# Patient Record
Sex: Male | Born: 1960 | Race: White | Hispanic: No | Marital: Single | Smoking: Former smoker
Health system: Southern US, Community
[De-identification: ages and names within clinical notes are randomized; demographics above are authoritative.]

## PROBLEM LIST (undated history)

## (undated) DIAGNOSIS — K219 Gastro-esophageal reflux disease without esophagitis: Secondary | ICD-10-CM

## (undated) DIAGNOSIS — I10 Essential (primary) hypertension: Secondary | ICD-10-CM

## (undated) DIAGNOSIS — K760 Fatty (change of) liver, not elsewhere classified: Secondary | ICD-10-CM

## (undated) DIAGNOSIS — N200 Calculus of kidney: Secondary | ICD-10-CM

## (undated) DIAGNOSIS — E7800 Pure hypercholesterolemia, unspecified: Secondary | ICD-10-CM

## (undated) DIAGNOSIS — J45909 Unspecified asthma, uncomplicated: Secondary | ICD-10-CM

## (undated) DIAGNOSIS — N529 Male erectile dysfunction, unspecified: Secondary | ICD-10-CM

## (undated) DIAGNOSIS — K224 Dyskinesia of esophagus: Secondary | ICD-10-CM

## (undated) DIAGNOSIS — E785 Hyperlipidemia, unspecified: Secondary | ICD-10-CM

## (undated) HISTORY — DX: Dyskinesia of esophagus: K22.4

## (undated) HISTORY — PX: CERVICAL DISCECTOMY: SHX98

## (undated) HISTORY — DX: Gastro-esophageal reflux disease without esophagitis: K21.9

## (undated) HISTORY — DX: Unspecified asthma, uncomplicated: J45.909

## (undated) HISTORY — DX: Fatty (change of) liver, not elsewhere classified: K76.0

## (undated) HISTORY — DX: Hyperlipidemia, unspecified: E78.5

## (undated) HISTORY — DX: Male erectile dysfunction, unspecified: N52.9

## (undated) HISTORY — DX: Calculus of kidney: N20.0

## (undated) HISTORY — DX: Pure hypercholesterolemia, unspecified: E78.00

## (undated) HISTORY — DX: Essential (primary) hypertension: I10

---

## 2004-02-05 ENCOUNTER — Encounter: Admission: RE | Admit: 2004-02-05 | Discharge: 2004-02-05 | Payer: Self-pay | Admitting: Emergency Medicine

## 2005-05-29 ENCOUNTER — Ambulatory Visit: Payer: Self-pay | Admitting: Gastroenterology

## 2005-05-30 ENCOUNTER — Ambulatory Visit: Payer: Self-pay | Admitting: Gastroenterology

## 2005-05-31 ENCOUNTER — Ambulatory Visit: Payer: Self-pay | Admitting: Gastroenterology

## 2006-01-08 ENCOUNTER — Encounter: Admission: RE | Admit: 2006-01-08 | Discharge: 2006-01-08 | Payer: Self-pay | Admitting: Emergency Medicine

## 2015-09-30 ENCOUNTER — Other Ambulatory Visit: Payer: Self-pay | Admitting: Physician Assistant

## 2015-09-30 ENCOUNTER — Ambulatory Visit
Admission: RE | Admit: 2015-09-30 | Discharge: 2015-09-30 | Disposition: A | Discharge status: Home / Self Care | Payer: BLUE CROSS/BLUE SHIELD | Source: Ambulatory Visit | Attending: Physician Assistant | Admitting: Physician Assistant

## 2015-09-30 DIAGNOSIS — M25551 Pain in right hip: Secondary | ICD-10-CM | POA: Diagnosis not present

## 2015-09-30 DIAGNOSIS — K219 Gastro-esophageal reflux disease without esophagitis: Secondary | ICD-10-CM | POA: Diagnosis not present

## 2015-09-30 DIAGNOSIS — E785 Hyperlipidemia, unspecified: Secondary | ICD-10-CM | POA: Diagnosis not present

## 2015-09-30 DIAGNOSIS — M25552 Pain in left hip: Principal | ICD-10-CM

## 2015-09-30 DIAGNOSIS — I10 Essential (primary) hypertension: Secondary | ICD-10-CM | POA: Diagnosis not present

## 2015-09-30 DIAGNOSIS — M2550 Pain in unspecified joint: Secondary | ICD-10-CM | POA: Diagnosis not present

## 2016-10-15 DIAGNOSIS — L720 Epidermal cyst: Secondary | ICD-10-CM | POA: Diagnosis not present

## 2017-01-24 DIAGNOSIS — I10 Essential (primary) hypertension: Secondary | ICD-10-CM | POA: Diagnosis not present

## 2017-01-24 DIAGNOSIS — Z23 Encounter for immunization: Secondary | ICD-10-CM | POA: Diagnosis not present

## 2017-01-24 DIAGNOSIS — E7800 Pure hypercholesterolemia, unspecified: Secondary | ICD-10-CM | POA: Diagnosis not present

## 2017-08-06 ENCOUNTER — Other Ambulatory Visit: Payer: Self-pay | Admitting: Gastroenterology

## 2017-08-06 DIAGNOSIS — R109 Unspecified abdominal pain: Secondary | ICD-10-CM

## 2017-08-07 ENCOUNTER — Other Ambulatory Visit: Payer: Self-pay | Admitting: Gastroenterology

## 2017-08-07 DIAGNOSIS — R945 Abnormal results of liver function studies: Principal | ICD-10-CM

## 2017-08-07 DIAGNOSIS — R7989 Other specified abnormal findings of blood chemistry: Secondary | ICD-10-CM

## 2017-08-10 ENCOUNTER — Ambulatory Visit
Admission: RE | Admit: 2017-08-10 | Discharge: 2017-08-10 | Disposition: A | Discharge status: Home / Self Care | Payer: BLUE CROSS/BLUE SHIELD | Source: Ambulatory Visit | Attending: Gastroenterology | Admitting: Gastroenterology

## 2017-08-10 DIAGNOSIS — R945 Abnormal results of liver function studies: Principal | ICD-10-CM

## 2017-08-10 DIAGNOSIS — R7989 Other specified abnormal findings of blood chemistry: Secondary | ICD-10-CM | POA: Diagnosis not present

## 2017-08-14 ENCOUNTER — Other Ambulatory Visit: Payer: BLUE CROSS/BLUE SHIELD

## 2017-08-21 ENCOUNTER — Other Ambulatory Visit: Payer: Self-pay | Admitting: Gastroenterology

## 2017-08-21 DIAGNOSIS — K760 Fatty (change of) liver, not elsewhere classified: Secondary | ICD-10-CM

## 2017-08-21 DIAGNOSIS — R1012 Left upper quadrant pain: Secondary | ICD-10-CM

## 2017-08-22 ENCOUNTER — Ambulatory Visit
Admission: RE | Admit: 2017-08-22 | Discharge: 2017-08-22 | Disposition: A | Discharge status: Home / Self Care | Payer: BLUE CROSS/BLUE SHIELD | Source: Ambulatory Visit | Attending: Gastroenterology | Admitting: Gastroenterology

## 2017-08-22 DIAGNOSIS — K760 Fatty (change of) liver, not elsewhere classified: Secondary | ICD-10-CM

## 2017-08-22 DIAGNOSIS — R1012 Left upper quadrant pain: Secondary | ICD-10-CM

## 2017-08-22 DIAGNOSIS — R140 Abdominal distension (gaseous): Secondary | ICD-10-CM | POA: Diagnosis not present

## 2017-08-22 MED ORDER — IOPAMIDOL (ISOVUE-300) INJECTION 61%
100.0000 mL | Freq: Once | INTRAVENOUS | Status: AC | PRN
  Administered 2017-08-22: 100 mL via INTRAVENOUS

## 2017-10-22 DIAGNOSIS — N281 Cyst of kidney, acquired: Secondary | ICD-10-CM | POA: Diagnosis not present

## 2017-10-22 DIAGNOSIS — R109 Unspecified abdominal pain: Secondary | ICD-10-CM | POA: Diagnosis not present

## 2017-12-18 DIAGNOSIS — I10 Essential (primary) hypertension: Secondary | ICD-10-CM | POA: Diagnosis not present

## 2017-12-18 DIAGNOSIS — Z125 Encounter for screening for malignant neoplasm of prostate: Secondary | ICD-10-CM | POA: Diagnosis not present

## 2017-12-18 DIAGNOSIS — E7800 Pure hypercholesterolemia, unspecified: Secondary | ICD-10-CM | POA: Diagnosis not present

## 2017-12-18 DIAGNOSIS — J45909 Unspecified asthma, uncomplicated: Secondary | ICD-10-CM | POA: Diagnosis not present

## 2018-06-26 DIAGNOSIS — Z0000 Encounter for general adult medical examination without abnormal findings: Secondary | ICD-10-CM | POA: Diagnosis not present

## 2018-06-26 DIAGNOSIS — E7800 Pure hypercholesterolemia, unspecified: Secondary | ICD-10-CM | POA: Diagnosis not present

## 2018-12-02 DIAGNOSIS — J45909 Unspecified asthma, uncomplicated: Secondary | ICD-10-CM | POA: Diagnosis not present

## 2018-12-02 DIAGNOSIS — I10 Essential (primary) hypertension: Secondary | ICD-10-CM | POA: Diagnosis not present

## 2018-12-02 DIAGNOSIS — E7800 Pure hypercholesterolemia, unspecified: Secondary | ICD-10-CM | POA: Diagnosis not present

## 2018-12-02 DIAGNOSIS — N529 Male erectile dysfunction, unspecified: Secondary | ICD-10-CM | POA: Diagnosis not present

## 2019-03-26 DIAGNOSIS — E7800 Pure hypercholesterolemia, unspecified: Secondary | ICD-10-CM | POA: Diagnosis not present

## 2019-03-26 DIAGNOSIS — I10 Essential (primary) hypertension: Secondary | ICD-10-CM | POA: Diagnosis not present

## 2019-03-26 DIAGNOSIS — K219 Gastro-esophageal reflux disease without esophagitis: Secondary | ICD-10-CM | POA: Diagnosis not present

## 2019-07-04 IMAGING — US US ABDOMEN COMPLETE
2 series · 13 of 25 positions shown · non-contrast
Comparison: 01/08/2006 CT

CLINICAL DATA: Elevated LFT

EXAM:
ABDOMEN ULTRASOUND COMPLETE

[Series 1: us abdomen complete · 0.23mm/px · 11 of 84 slices shown (1 of 2)]
[im 1/84]
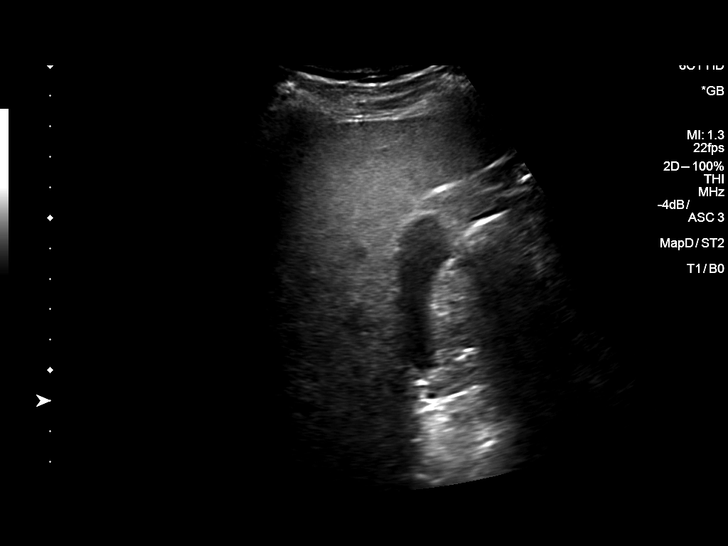
[im 8/84]
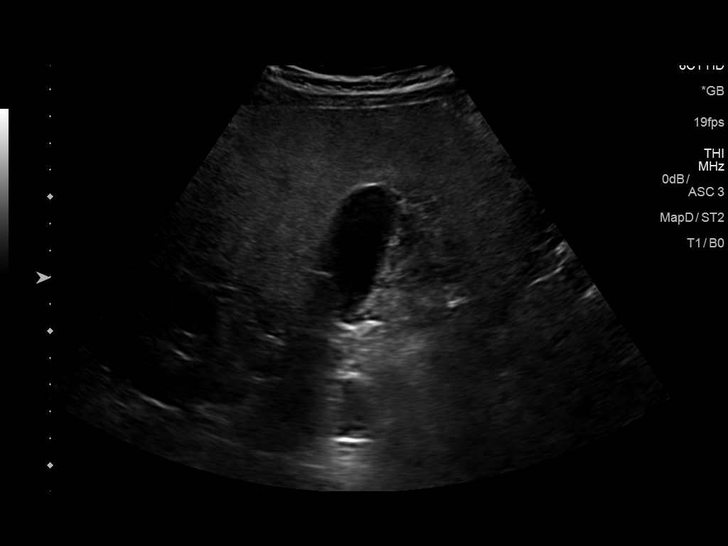
[im 16/84]
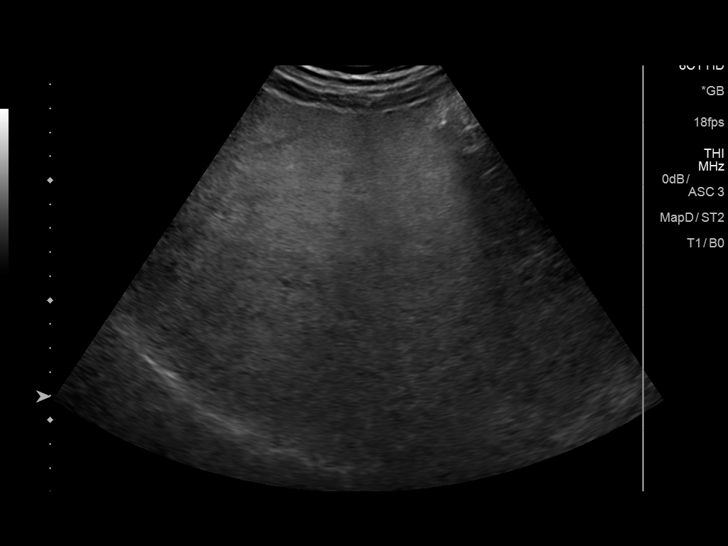
[im 24/84]
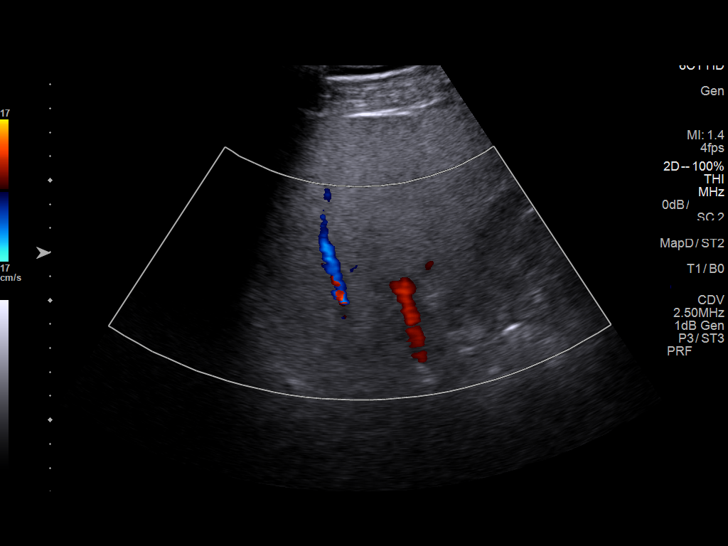
[im 32/84]
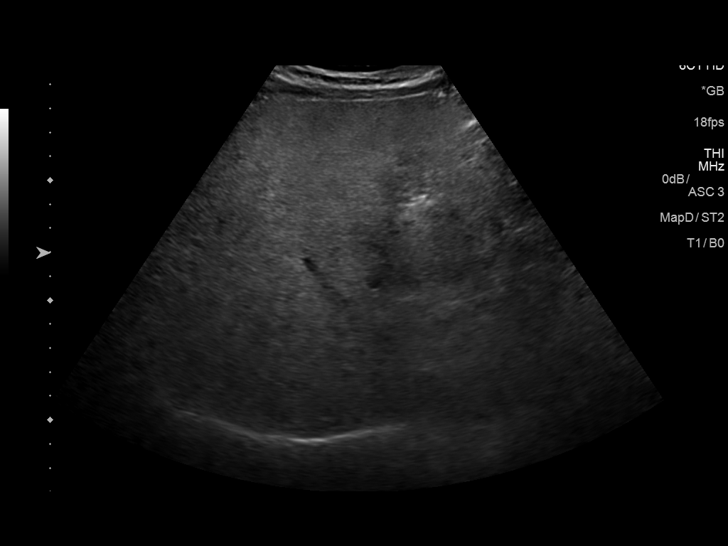
[im 40/84]
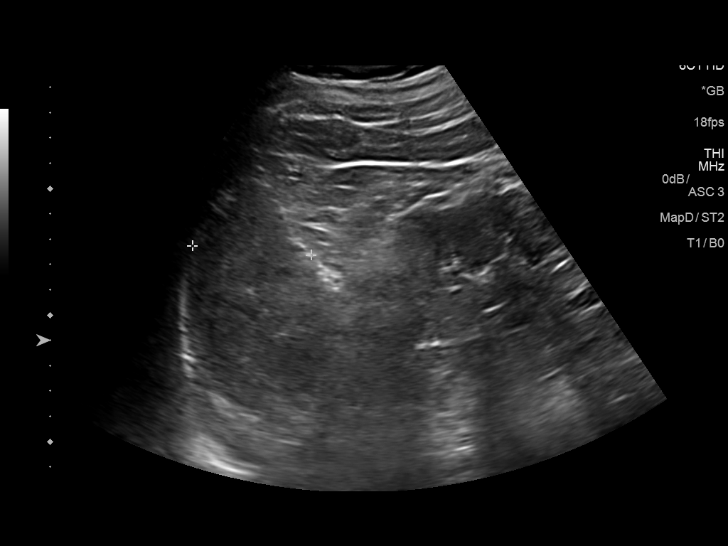
[im 48/84]
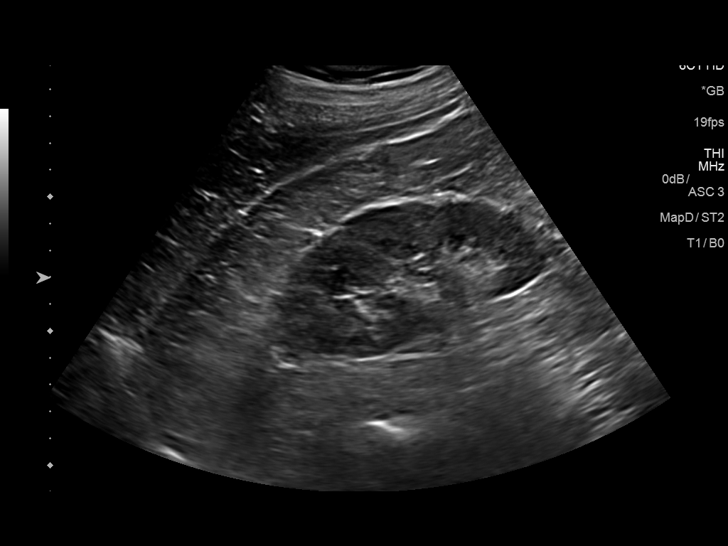
[im 56/84]
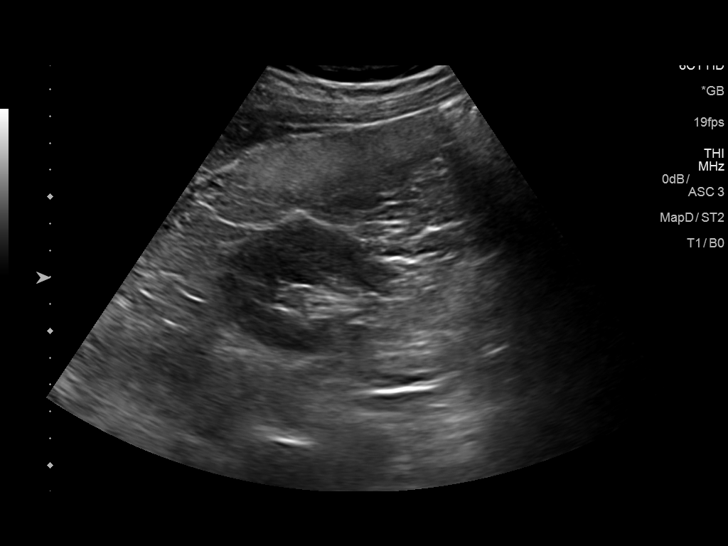
[im 64/84]
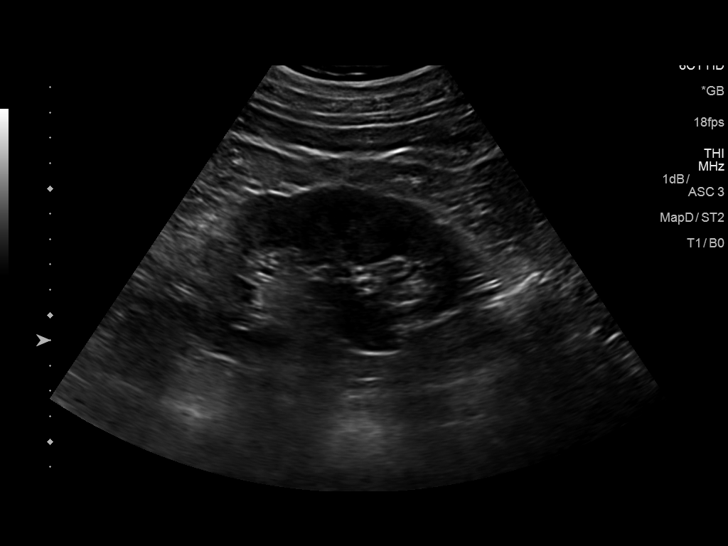
[im 72/84]
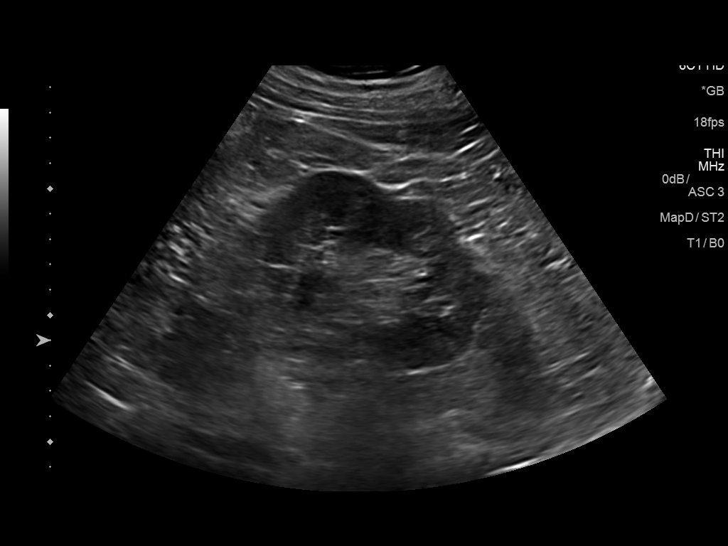
[im 80/84]
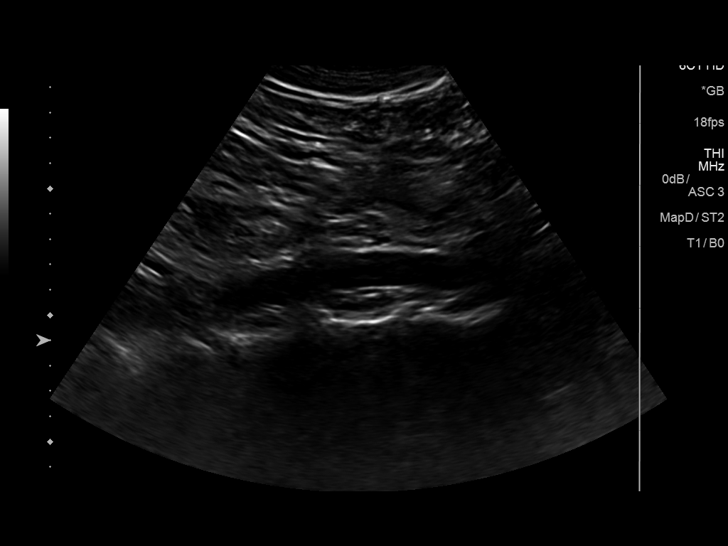

[Series 2001: us abdomen complete · 0.23mm/px · 2 of 11 slices shown (2 of 2)]
[im 1/11]
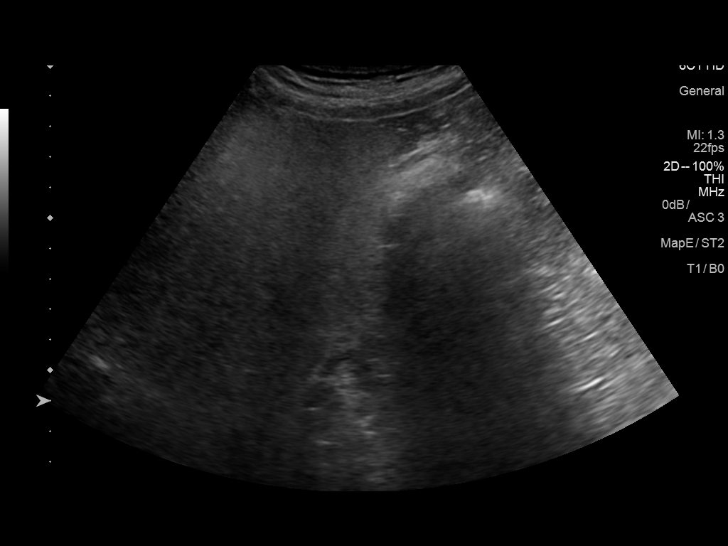
[im 11/11]
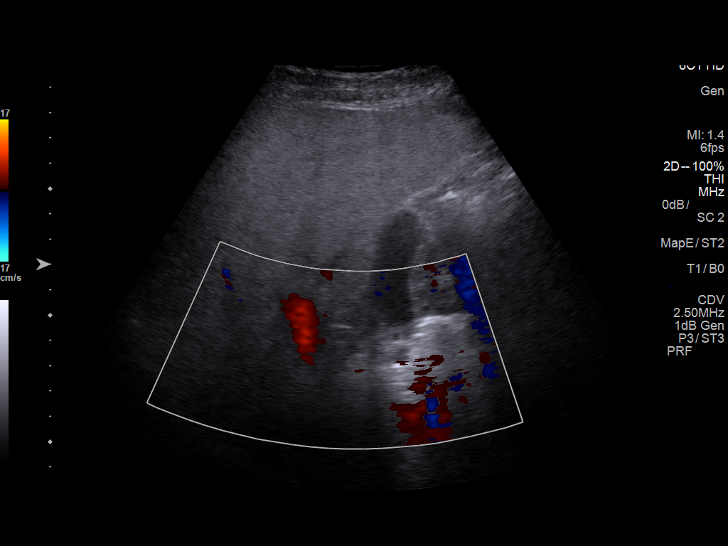

[13 of 25 positions shown; findings below may reference images not displayed]

FINDINGS: Gallbladder: No gallstones or wall thickening visualized. No
sonographic Murphy sign noted by sonographer.

Common bile duct: Diameter: 5.4 mm

Liver: Increased echogenicity without focal abnormality. Portal vein
is patent on color Doppler imaging with normal direction of blood
flow towards the liver.

IVC: No abnormality visualized.

Pancreas: Visualized portion unremarkable.

Spleen: Size and appearance within normal limits.

Right Kidney: Length: 11.8 cm. Echogenicity within normal limits. No
hydronephrosis.

Left Kidney: Length: 13.7 cm. Echogenicity within normal limits.
Dilated extrarenal pelvis.

Abdominal aorta: Aortic atherosclerosis. Maximum AP diameter of 3
cm.

Other findings: None.
IMPRESSION: 1. Coarse echogenic liver consistent with hepatic steatosis and or
hepatocellular disease.
2. Negative for gallstones or biliary dilatation
3. Aortic atherosclerosis with maximum AP diameter of the aorta up
to 3 cm. Recommend followup by ultrasound in 3 years. This
recommendation follows ACR consensus guidelines: White Paper of the
ACR Incidental Findings Committee II on Vascular Findings. [HOSPITAL] 2863; [DATE]

## 2019-07-21 DIAGNOSIS — Z0000 Encounter for general adult medical examination without abnormal findings: Secondary | ICD-10-CM | POA: Diagnosis not present

## 2019-07-21 DIAGNOSIS — Z125 Encounter for screening for malignant neoplasm of prostate: Secondary | ICD-10-CM | POA: Diagnosis not present

## 2019-07-21 DIAGNOSIS — E7800 Pure hypercholesterolemia, unspecified: Secondary | ICD-10-CM | POA: Diagnosis not present

## 2019-07-23 DIAGNOSIS — Z1159 Encounter for screening for other viral diseases: Secondary | ICD-10-CM | POA: Diagnosis not present

## 2019-07-28 DIAGNOSIS — D123 Benign neoplasm of transverse colon: Secondary | ICD-10-CM | POA: Diagnosis not present

## 2019-07-28 DIAGNOSIS — Z800 Family history of malignant neoplasm of digestive organs: Secondary | ICD-10-CM | POA: Diagnosis not present

## 2019-07-28 DIAGNOSIS — Z1211 Encounter for screening for malignant neoplasm of colon: Secondary | ICD-10-CM | POA: Diagnosis not present

## 2020-01-21 DIAGNOSIS — E7800 Pure hypercholesterolemia, unspecified: Secondary | ICD-10-CM | POA: Diagnosis not present

## 2020-01-21 DIAGNOSIS — K219 Gastro-esophageal reflux disease without esophagitis: Secondary | ICD-10-CM | POA: Diagnosis not present

## 2020-01-21 DIAGNOSIS — I10 Essential (primary) hypertension: Secondary | ICD-10-CM | POA: Diagnosis not present

## 2020-01-21 DIAGNOSIS — R002 Palpitations: Secondary | ICD-10-CM | POA: Diagnosis not present

## 2020-02-19 ENCOUNTER — Encounter: Payer: Self-pay | Admitting: Internal Medicine

## 2020-02-19 ENCOUNTER — Ambulatory Visit: Payer: BC Managed Care – PPO | Admitting: Internal Medicine

## 2020-02-19 ENCOUNTER — Encounter: Payer: Self-pay | Admitting: *Deleted

## 2020-02-19 ENCOUNTER — Other Ambulatory Visit: Payer: Self-pay

## 2020-02-19 VITALS — BP 122/82 | HR 91 | Ht 66.0 in | Wt 178.0 lb

## 2020-02-19 DIAGNOSIS — E785 Hyperlipidemia, unspecified: Secondary | ICD-10-CM

## 2020-02-19 DIAGNOSIS — I10 Essential (primary) hypertension: Secondary | ICD-10-CM | POA: Diagnosis not present

## 2020-02-19 DIAGNOSIS — R002 Palpitations: Secondary | ICD-10-CM | POA: Insufficient documentation

## 2020-02-19 NOTE — Patient Instructions (Addendum)
Medication Instructions:  Your physician recommends that you continue on your current medications as directed. Please refer to the Current Medication list given to you today.  *If you need a refill on your cardiac medications before your next appointment, please call your pharmacy*   Lab Work: none If you have labs (blood work) drawn today and your tests are completely normal, you will receive your results only by: Marland Kitchen MyChart Message (if you have MyChart) OR . A paper copy in the mail If you have any lab test that is abnormal or we need to change your treatment, we will call you to review the results.   Testing/Procedures: Your physician has recommended that you wear a holter monitor. Holter monitors are medical devices that record the heart's electrical activity. Doctors most often use these monitors to diagnose arrhythmias. Arrhythmias are problems with the speed or rhythm of the heartbeat. The monitor is a small, portable device. You can wear one while you do your normal daily activities. This is usually used to diagnose what is causing palpitations/syncope (passing out).--14 day zio     Follow-Up: At [facility], you and your health needs are our priority.  As part of our continuing mission to provide you with exceptional heart care, we have created designated Provider Care Teams.  These Care Teams include your primary Cardiologist (physician) and Advanced Practice Providers (APPs -  Physician Assistants and Nurse Practitioners) who all work together to provide you with the care you need, when you need it.  We recommend signing up for the patient portal called "MyChart".  Sign up information is provided on this After Visit Summary.  MyChart is used to connect with patients for Virtual Visits (Telemedicine).  Patients are able to view lab/test results, encounter notes, upcoming appointments, etc.  Non-urgent messages can be sent to your provider as well.   To learn more about what you  can do with MyChart, go to [email or web address].    Your next appointment:   one year(s)  The format for your next appointment:   In Person  Provider:   You may see Christell Constant, MD or one of the following Advanced Practice Providers on your designated Care Team:    Ronie Spies, PA-C  Jacolyn Reedy, PA-C    Other Instructions   Jerry James- Long Term Monitor Instructions   Your physician has requested you wear your ZIO patch monitor_14______days.   This is a single patch monitor.  [employer] supplies one patch monitor per enrollment.  Additional stickers are not available.   Please do not apply patch if you will be having a Nuclear Stress Test, Echocardiogram, Cardiac CT, MRI, or Chest Xray during the time frame you would be wearing the monitor. The patch cannot be worn during these tests.  You cannot remove and re-apply the ZIO XT patch monitor.   Your ZIO patch monitor will be sent USPS Priority mail from [facility] directly to your home address. The monitor may also be mailed to a PO BOX if home delivery is not available.   It may take 3-5 days to receive your monitor after you have been enrolled.   Once you have received you monitor, please review enclosed instructions.  Your monitor has already been registered assigning a specific monitor serial # to you.   Applying the monitor   Shave hair from upper left chest.   Hold abrader disc by orange tab.  Rub abrader in 40 strokes over left upper chest as indicated in  your monitor instructions.   Clean area with 4 enclosed alcohol pads .  Use all pads to assure are is cleaned thoroughly.  Let dry.   Apply patch as indicated in monitor instructions.  Patch will be place under collarbone on left side of chest with arrow pointing upward.   Rub patch adhesive wings for 2 minutes.Remove white label marked "1".  Remove white label marked "2".  Rub patch adhesive wings for 2 additional minutes.   While looking in a  mirror, press and release button in center of patch.  A small green light will flash 3-4 times .  This will be your only indicator the monitor has been turned on.     Do not shower for the first 24 hours.  You may shower after the first 24 hours.   Press button if you feel a symptom. You will hear a small click.  Record Date, Time and Symptom in the Patient Log Book.   When you are ready to remove patch, follow instructions on last 2 pages of Patient Log Book.  Stick patch monitor onto last page of Patient Log Book.   Place Patient Log Book in [place] box.  Use locking tab on box and tape box closed securely.  The [employer] and [employer] has [employer] on it.  Please place in mailbox as soon as possible.  Your physician should have your test results approximately 7 days after the monitor has been mailed back to [facility].   Call [facility] Customer Care at [phone] if you have questions regarding your ZIO XT patch monitor.  Call them immediately if you see an orange light blinking on your monitor.   If your monitor falls off in less than 4 days contact our Monitor department at [phone].  If your monitor becomes loose or falls off after 4 days call [employer] at [phone] for suggestions on securing your monitor.

## 2020-02-19 NOTE — Progress Notes (Signed)
Patient ID: Jerry James, male   DOB: 1961-03-02, 59 y.o.   MRN: [id number] Patient enrolled for [employer] to ship a 14 day ZIO XT long term holter monitor to his home.

## 2020-02-19 NOTE — Progress Notes (Signed)
Cardiology Office Note:    Date:  02/19/2020   ID:  Jerry James, DOB 1961-03-08, MRN [id number]  PCP:  Jerry Heron, MD   CC: Palpitations Consulted for the evaluation of palpitations at the behest of Jerry James, Jerry Dance, MD  History of Present Illness:    Jerry James is a 59 y.o. male with a hx of HTN, HLD, Erectily Dysfunction, and esophageal spasm who presents for palpitations.  Patient notes that he has had palpitations that have worsened over the years.  Heart would start racing with heart rate 140 bpm or above.  Would last for hours.  Symptomatic that would wake him from sleep. Would feel weak and faint.  Found that he had low blood pressure with recent stop on HCTZ.  And was diagnosed with acute prostatitis.  Complicated by hypotension.  Post the stopping of his blood pressure medication and with improvement in his hypotension; his palpitations have greatly improved.  During the time of his palpitations, he was changing a variety of blood pressure medications.   Ambulatory blood pressure- 120/80s only on losartan  Past Medical History:  Diagnosis Date  . Asthma   . ED (erectile dysfunction)   . Esophageal spasm   . Fatty liver   . GERD (gastroesophageal reflux disease)   . Hypercholesterolemia   . Hyperlipidemia   . Hypertension   . Kidney stones    Past Surgical History:  Procedure Laterality Date  . CERVICAL DISCECTOMY      Current Medications: Current Meds  Medication Sig  . albuterol (VENTOLIN HFA) 108 (90 Base) MCG/ACT inhaler Inhale into the lungs every 6 (six) hours as needed for wheezing or shortness of breath.  Marland Kitchen aspirin EC 81 MG tablet Take 81 mg by mouth daily. Swallow whole.  . esomeprazole (NEXIUM) 20 MG capsule Take 20 mg by mouth daily at 12 noon.  . fluticasone (FLOVENT HFA) 220 MCG/ACT inhaler Inhale into the lungs 2 (two) times daily.  Marland Kitchen losartan (COZAAR) 100 MG tablet Take 100 mg by mouth daily.  . Milk Thistle 1000 MG CAPS Take  by mouth.  . Nutritional Supplements (DHEA PO) Take 100 mg by mouth.  . Omega-3 Fatty Acids (FISH OIL) 1000 MG CAPS Take by mouth.  . rosuvastatin (CRESTOR) 10 MG tablet Take 10 mg by mouth daily.    Allergies:   Patient has no known allergies.   Social History   Socioeconomic History  . Marital status: Single    Spouse name: Not on file  . Number of children: Not on file  . Years of education: Not on file  . Highest education level: Not on file  Occupational History  . Not on file  Tobacco Use  . Smoking status: Former Smoker    Types: Cigarettes    Quit date: 2000    Years since quitting: 21.8  . Smokeless tobacco: Former Neurosurgeon    Types: Snuff    Quit date: 1980  Substance and Sexual Activity  . Alcohol use: Not on file  . Drug use: Not on file  . Sexual activity: Not on file  Other Topics Concern  . Not on file  Social History Narrative  . Not on file   Social Determinants of Health   Financial Resource Strain:   . Difficulty of Paying Living Expenses: Not on file  Food Insecurity:   . Worried About Programme researcher, broadcasting/film/video in the Last Year: Not on file  . Ran Out of Food in the  Last Year: Not on file  Transportation Needs:   . Lack of Transportation (Medical): Not on file  . Lack of Transportation (Non-Medical): Not on file  Physical Activity:   . Days of Exercise per Week: Not on file  . Minutes of Exercise per Session: Not on file  Stress:   . Feeling of Stress : Not on file  Social Connections:   . Frequency of Communication with Friends and Family: Not on file  . Frequency of Social Gatherings with Friends and Family: Not on file  . Attends Religious Services: Not on file  . Active Member of Clubs or Organizations: Not on file  . Attends Banker Meetings: Not on file  . Marital Status: Not on file    Family History: The patient's family history includes Benign prostatic hyperplasia in his brother; Cancer - Colon in his brother; Cancer - Lung in  his father; Diabetes in his paternal grandmother; Heart attack in his paternal grandfather; Hyperlipidemia in his brother; Hypertension in his brother and mother.  No family history of arrhythmias.  ROS:   Please see the history of present illness.    All other systems reviewed and are negative.  EKGs/Labs/Other Studies Reviewed:    The following studies were reviewed today:  EKG:  EKG is ordered today.  The ekg ordered today demonstrates sinus rhythm rate 91, no ST T changes.  01/21/2020 LDL 46 HDL 32 Triglycerides 106 Total 98  Creatinine 0.910 TSH 1.950  Physical Exam:    VS:  BP 122/82   Pulse 91   Ht 5\' 6"  (1.676 m)   Wt 178 lb (80.7 kg)   SpO2 95%   BMI 28.73 kg/m     Wt Readings from Last 3 Encounters:  02/19/20 178 lb (80.7 kg)     GEN: Well nourished, well developed in no acute distress HEENT: Normal NECK: No JVD; No carotid bruits LYMPHATICS: No lymphadenopathy CARDIAC: RRR, no murmurs, rubs, gallops RESPIRATORY:  Clear to auscultation without rales, wheezing or rhonchi  ABDOMEN: Soft, non-tender, non-distended MUSCULOSKELETAL:  No edema; No deformity  SKIN: Warm and dry NEUROLOGIC:  Alert and oriented x 3 PSYCHIATRIC:  Normal affect   ASSESSMENT:    1. Palpitations   2. Essential hypertension   3. Hyperlipidemia, unspecified hyperlipidemia type    PLAN:    In order of problems listed above:  Palpitations, HTN, and HLD - BP well controlled on current medications - HLD controlled well on current medications - palpitations may have been sinus tachycardia  - will check Ziopatch 14 day non live to rule out arrhythmia  1 year follow up unless new symptoms or abnormal test results warranting change in plan  Would be reasonable for Virtual Follow up Would be reasonable for APP Follow up   Medication Adjustments/Labs and Tests Ordered: Current medicines are reviewed at length with the patient today.  Concerns regarding medicines are outlined  above.  No orders of the defined types were placed in this encounter.  No orders of the defined types were placed in this encounter.   There are no Patient Instructions on file for this visit.   Signed, 02/21/20, MD  02/19/2020 1:57 PM    [facility]

## 2020-02-23 ENCOUNTER — Ambulatory Visit (INDEPENDENT_AMBULATORY_CARE_PROVIDER_SITE_OTHER): Payer: BC Managed Care – PPO

## 2020-02-23 DIAGNOSIS — R002 Palpitations: Secondary | ICD-10-CM | POA: Diagnosis not present

## 2020-02-24 DIAGNOSIS — R3915 Urgency of urination: Secondary | ICD-10-CM | POA: Diagnosis not present

## 2020-02-24 DIAGNOSIS — N410 Acute prostatitis: Secondary | ICD-10-CM | POA: Diagnosis not present

## 2020-02-24 DIAGNOSIS — N401 Enlarged prostate with lower urinary tract symptoms: Secondary | ICD-10-CM | POA: Diagnosis not present

## 2020-03-08 DIAGNOSIS — R609 Edema, unspecified: Secondary | ICD-10-CM | POA: Diagnosis not present

## 2020-03-12 DIAGNOSIS — R609 Edema, unspecified: Secondary | ICD-10-CM | POA: Diagnosis not present

## 2020-03-22 ENCOUNTER — Telehealth: Payer: Self-pay

## 2020-03-22 MED ORDER — METOPROLOL TARTRATE 25 MG PO TABS: 12.5000 mg | ORAL_TABLET | Freq: Two times a day (BID) | ORAL | 3 refills | Status: DC

## 2020-03-22 NOTE — Telephone Encounter (Signed)
The patient has been notified of the result and verbalized understanding.  All questions (if any) were answered. Leanord Hawking, RN 03/22/2020 9:14 AM

## 2020-03-22 NOTE — Telephone Encounter (Signed)
-----   Message from Christell Constant, MD sent at 03/21/2020  5:10 PM EST ----- Results: Rare, symptomatic SVT Plan Metoprolol 12.5 MG PO BID, have patient check blood pressure on this medication  Christell Constant, MD

## 2021-03-20 DIAGNOSIS — I471 Supraventricular tachycardia: Secondary | ICD-10-CM | POA: Insufficient documentation

## 2021-03-20 NOTE — Progress Notes (Signed)
Cardiology Office Note:    Date:  03/21/2021   ID:  Jerry James, DOB 04-Jul-1960, MRN [id number]  PCP:  Clayborn Heron, MD   [facility] Providers Cardiologist:  Christell Constant, MD     Referring MD: Clayborn Heron, MD   Chief Complaint:  f/u for SVT    Patient Profile:   Jerry James is a 60 y.o. male with:  Hypertension  Hyperlipidemia  Esophageal spasm Palpitations Monitor 11/21: Rare symptomatic PACs, brief Supraventricular Tachycardia Hepatic steatosis   History of Present Illness: Jerry James was evaluated in 10/21 by Dr. Izora Ribas for palpitations.  An event monitor demonstrated rare symptomatic PACs and brief (11 beats) Supraventricular Tachycardia.  He was started on metoprolol tartrate.  He returns for f/u.  He is here alone.  His palpitations are much improved on beta-blocker Rx.  He occasionally has skipped beats.  This is worse with caffeine or stress.  He has not had chest pain, shortness of breath, syncope, leg edema.         ASSESSMENT & PLAN:   SVT (supraventricular tachycardia) (HCC) Brief episodes noted on monitor last year.  This is much improved on beta-blocker Rx.  He is taking metoprolol tartrate 25 mg once daily daily instead of 12.5 mg twice daily.  We discussed changing to metoprolol succinate 50 mg once daily.  He would like to take the long acting form but only wants to take 25 mg once daily.    -DC metoprolol tartrate 25 mg  -Start Metoprolol succinate 25 mg once daily  -Continue to watch caffeine  -F/u 1 year.   Hyperlipidemia Managed by primary care.  Per [facility], LDL on 02/28/21 was 49.  He remains on Rosuvastatin.  Essential hypertension BP is well controlled on a combination Losartan, Metoprolol, HCTZ.            Dispo:  Return in about 1 year (around 03/21/2022) for Routine Follow Up w/ Dr. Izora Ribas.    Prior CV studies: LONG TERM MONITOR (8-14 DAYS) INTERPRETATION 03/17/2020 Narrative  Patient had a  min HR of 43 bpm, max HR of 172 bpm, and avg HR of 77 bpm.  Predominant underlying rhythm was sinus rhythm.  One run of tachycardia occurred lasting 11 beats with a max rate of 162 bpm.  This was triggered as symptomatic.  Isolated PACs were rare (<1.0%), with rare couplets or triplets present.  Isolated PVCs were rare (<1.0%), with rare couplets.  No evidence of complete heart block.  Triggered and diary events associated with sinus rhythm, SVT, and PACs. Rare, symptomatic, SVT and PACs.     Past Medical History:  Diagnosis Date   Asthma    ED (erectile dysfunction)    Esophageal spasm    Fatty liver    GERD (gastroesophageal reflux disease)    Hypercholesterolemia    Hyperlipidemia    Hypertension    Kidney stones    Current Medications: Current Meds  Medication Sig   albuterol (VENTOLIN HFA) 108 (90 Base) MCG/ACT inhaler Inhale into the lungs every 6 (six) hours as needed for wheezing or shortness of breath.   aspirin EC 81 MG tablet Take 81 mg by mouth daily. Swallow whole.   esomeprazole (NEXIUM) 20 MG capsule Take 20 mg by mouth daily at 12 noon.   fluticasone (FLOVENT HFA) 220 MCG/ACT inhaler Inhale into the lungs 2 (two) times daily.   hydrochlorothiazide (HYDRODIURIL) 25 MG tablet TAKE 1 TABLET BY MOUTH EVERY DAY IN THE MORNING  losartan (COZAAR) 100 MG tablet Take 100 mg by mouth daily.   metoprolol succinate (TOPROL XL) 25 MG 24 hr tablet Take 1 tablet (25 mg total) by mouth daily.   Milk Thistle 1000 MG CAPS Take 1,000 mg by mouth daily.   Nutritional Supplements (DHEA PO) Take 100 mg by mouth daily.   Omega-3 Fatty Acids (FISH OIL) 1000 MG CAPS Take 1,000 mg by mouth daily.   rosuvastatin (CRESTOR) 10 MG tablet Take 10 mg by mouth daily.   sildenafil (REVATIO) 20 MG tablet Take 20 mg by mouth as needed (dysfunction).   tamsulosin (FLOMAX) 0.4 MG CAPS capsule 0.4 mg daily.   [DISCONTINUED] metoprolol succinate (TOPROL-XL) 50 MG 24 hr tablet Take 1 tablet (50  mg total) by mouth daily. Take with or immediately following a meal.   [DISCONTINUED] metoprolol tartrate (LOPRESSOR) 25 MG tablet Take 0.5 tablets (12.5 mg total) by mouth 2 (two) times daily.    Allergies:   Patient has no known allergies.   Social History   Tobacco Use   Smoking status: Former    Types: Cigarettes    Quit date: 2000    Years since quitting: 22.9   Smokeless tobacco: Former    Types: Snuff    Quit date: 71    Family Hx: The patient's family history includes Benign prostatic hyperplasia in his brother; Cancer - Colon in his brother; Cancer - Lung in his father; Diabetes in his paternal grandmother; Heart attack in his paternal grandfather; Hyperlipidemia in his brother; Hypertension in his brother and mother.  ROS   EKGs/Labs/Other Test Reviewed:    EKG:  EKG is   ordered today.  The ekg ordered today demonstrates normal sinus rhythm, HR 65, normal axis, no ST-TW changes, QTc 399 ms  Recent Labs: No results found for requested labs within last 8760 hours.   Recent Lipid Panel No results found for: CHOL, TRIG, HDL, LDLCALC, LDLDIRECT   Risk Assessment/Calculations:          Physical Exam:    VS:  BP 120/78   Pulse 65   Ht 5\' 6"  (1.676 m)   Wt 171 lb 3.2 oz (77.7 kg)   SpO2 98%   BMI 27.63 kg/m     Wt Readings from Last 3 Encounters:  03/21/21 171 lb 3.2 oz (77.7 kg)  02/19/20 178 lb (80.7 kg)    Constitutional:      Appearance: Healthy appearance. Not in distress.  Neck:     Vascular: No carotid bruit. JVD normal.  Pulmonary:     Effort: Pulmonary effort is normal.     Breath sounds: No wheezing. No rales.  Cardiovascular:     Normal rate. Regular rhythm. Normal S1. Normal S2.      Murmurs: There is no murmur.  Pulses:    Intact distal pulses.  Edema:    Peripheral edema absent.  Abdominal:     Palpations: Abdomen is soft. There is no hepatomegaly.  Skin:    General: Skin is warm and dry.  Neurological:     General: No focal  deficit present.     Mental Status: Alert and oriented to person, place and time.     Cranial Nerves: Cranial nerves are intact.        Medication Adjustments/Labs and Tests Ordered: Current medicines are reviewed at length with the patient today.  Concerns regarding medicines are outlined above.  Tests Ordered: Orders Placed This Encounter  Procedures   EKG 12-Lead   Medication  Changes: Meds ordered this encounter  Medications   DISCONTD: metoprolol succinate (TOPROL-XL) 50 MG 24 hr tablet    Sig: Take 1 tablet (50 mg total) by mouth daily. Take with or immediately following a meal.    Dispense:  90 tablet    Refill:  3   metoprolol succinate (TOPROL XL) 25 MG 24 hr tablet    Sig: Take 1 tablet (25 mg total) by mouth daily.    Dispense:  90 tablet    Refill:  3   Signed, Tereso Newcomer, PA-C  03/21/2021 11:20 AM    [facility] [address] [place], [place], [place]  [address] Phone: [phone]; Fax: [phone]

## 2021-03-21 ENCOUNTER — Encounter: Payer: Self-pay | Admitting: Physician Assistant

## 2021-03-21 ENCOUNTER — Ambulatory Visit (INDEPENDENT_AMBULATORY_CARE_PROVIDER_SITE_OTHER): Payer: 59 | Admitting: Physician Assistant

## 2021-03-21 ENCOUNTER — Other Ambulatory Visit: Payer: Self-pay

## 2021-03-21 VITALS — BP 120/78 | HR 65 | Ht 66.0 in | Wt 171.2 lb

## 2021-03-21 DIAGNOSIS — I10 Essential (primary) hypertension: Secondary | ICD-10-CM | POA: Diagnosis not present

## 2021-03-21 DIAGNOSIS — E782 Mixed hyperlipidemia: Secondary | ICD-10-CM | POA: Diagnosis not present

## 2021-03-21 DIAGNOSIS — I471 Supraventricular tachycardia: Secondary | ICD-10-CM | POA: Diagnosis not present

## 2021-03-21 MED ORDER — METOPROLOL SUCCINATE ER 25 MG PO TB24: 25.0000 mg | ORAL_TABLET | Freq: Every day | ORAL | 3 refills | Status: DC

## 2021-03-21 MED ORDER — METOPROLOL SUCCINATE ER 50 MG PO TB24: 50.0000 mg | ORAL_TABLET | Freq: Every day | ORAL | 3 refills | Status: DC

## 2021-03-21 NOTE — Assessment & Plan Note (Signed)
Brief episodes noted on monitor last year.  This is much improved on beta-blocker Rx.  He is taking metoprolol tartrate 25 mg once daily daily instead of 12.5 mg twice daily.  We discussed changing to metoprolol succinate 50 mg once daily.  He would like to take the long acting form but only wants to take 25 mg once daily.    -DC metoprolol tartrate 25 mg  -Start Metoprolol succinate 25 mg once daily  -Continue to watch caffeine  -F/u 1 year.

## 2021-03-21 NOTE — Patient Instructions (Addendum)
Medication Instructions:    FINISH Metoprolol tartrate than stop  START Toprol XL one (1) tablet by mouth ( 25 mg) daily  *If you need a refill on your cardiac medications before your next appointment, please call your pharmacy*   Lab Work:  -NONE  If you have labs (blood work) drawn today and your tests are completely normal, you will receive your results only by: MyChart Message (if you have MyChart) OR A paper copy in the mail If you have any lab test that is abnormal or we need to change your treatment, we will call you to review the results.   Testing/Procedures:  -NONE   Follow-Up: At [facility], you and your health needs are our priority.  As part of our continuing mission to provide you with exceptional heart care, we have created designated Provider Care Teams.  These Care Teams include your primary Cardiologist (physician) and Advanced Practice Providers (APPs -  Physician Assistants and Nurse Practitioners) who all work together to provide you with the care you need, when you need it.  We recommend signing up for the patient portal called "MyChart".  Sign up information is provided on this After Visit Summary.  MyChart is used to connect with patients for Virtual Visits (Telemedicine).  Patients are able to view lab/test results, encounter notes, upcoming appointments, etc.  Non-urgent messages can be sent to your provider as well.   To learn more about what you can do with MyChart, go to [email or web address].    Your next appointment:   1 year(s)  The format for your next appointment:   In Person  Provider:   Christell Constant, MD     Other Instructions  Your physician wants you to follow-up in: 1 year with Dr. Izora Ribas.  You will receive a reminder letter in the mail two months in advance. If you don't receive a letter, please call our office to schedule the follow-up appointment.

## 2021-03-21 NOTE — Assessment & Plan Note (Signed)
Managed by primary care.  Per [facility], LDL on 02/28/21 was 49.  He remains on Rosuvastatin.

## 2021-03-21 NOTE — Assessment & Plan Note (Signed)
BP is well controlled on a combination Losartan, Metoprolol, HCTZ.

## 2021-09-21 ENCOUNTER — Other Ambulatory Visit: Payer: Self-pay | Admitting: Physician Assistant

## 2022-03-22 ENCOUNTER — Other Ambulatory Visit: Payer: Self-pay | Admitting: Internal Medicine

## 2022-03-29 ENCOUNTER — Ambulatory Visit: Payer: Commercial Managed Care - HMO | Attending: Internal Medicine | Admitting: Internal Medicine

## 2022-03-29 ENCOUNTER — Encounter: Payer: Self-pay | Admitting: Internal Medicine

## 2022-03-29 VITALS — BP 118/80 | HR 68 | Ht 66.0 in | Wt 178.0 lb

## 2022-03-29 DIAGNOSIS — I517 Cardiomegaly: Secondary | ICD-10-CM | POA: Diagnosis not present

## 2022-03-29 DIAGNOSIS — E782 Mixed hyperlipidemia: Secondary | ICD-10-CM

## 2022-03-29 DIAGNOSIS — I471 Supraventricular tachycardia, unspecified: Secondary | ICD-10-CM

## 2022-03-29 DIAGNOSIS — I10 Essential (primary) hypertension: Secondary | ICD-10-CM | POA: Diagnosis not present

## 2022-03-29 NOTE — Progress Notes (Signed)
Cardiology Office Note:    Date:  03/29/2022   ID:  Jerry James, DOB [birth year], MRN [id number]  PCP:  Jerry Heron, MD   CC: SVT f/u  History of Present Illness:    Jerry James is a 61 y.o. male with a hx of HTN, HLD, Erectile Dysfunction, and esophageal spasm. 2021: SVT 2022: transitioned to succinate with Tereso James  Patient notes that he is doing well.   Going to [place] (lives there 1/2 of the time). There are no interval hospital/ED visit.    No chest pain or pressure .  No SOB/DOE and no PND/Orthopnea.  No weight gain or leg swelling.  Rare palpitations that have improved.  Shorter duration.   He has lost weight and gained.    Past Medical History:  Diagnosis Date   Asthma    ED (erectile dysfunction)    Esophageal spasm    Fatty liver    GERD (gastroesophageal reflux disease)    Hypercholesterolemia    Hyperlipidemia    Hypertension    Kidney stones    Past Surgical History:  Procedure Laterality Date   CERVICAL DISCECTOMY      Current Medications: Current Meds  Medication Sig   albuterol (VENTOLIN HFA) 108 (90 Base) MCG/ACT inhaler Inhale into the lungs every 6 (six) hours as needed for wheezing or shortness of breath.   aspirin EC 81 MG tablet Take 81 mg by mouth daily. Swallow whole.   esomeprazole (NEXIUM) 20 MG capsule Take 20 mg by mouth daily at 12 noon.   hydrochlorothiazide (HYDRODIURIL) 25 MG tablet TAKE 1 TABLET BY MOUTH EVERY DAY IN THE MORNING   losartan (COZAAR) 100 MG tablet Take 100 mg by mouth daily.   metoprolol succinate (TOPROL-XL) 25 MG 24 hr tablet Take 1 tablet (25 mg total) by mouth daily. (NEED MD APPOINTMENT, SCHEDULE AN APPOINTMENT FOR FUTURE REFILLS) 1st Attempt   Milk Thistle 1000 MG CAPS Take 1,000 mg by mouth daily.   Nutritional Supplements (DHEA PO) Take 100 mg by mouth daily.   Omega-3 Fatty Acids (FISH OIL) 1000 MG CAPS Take 1,000 mg by mouth daily.   rosuvastatin (CRESTOR) 10 MG tablet Take 10 mg  by mouth daily.   sildenafil (REVATIO) 20 MG tablet Take 20 mg by mouth as needed (dysfunction).   tamsulosin (FLOMAX) 0.4 MG CAPS capsule 0.4 mg daily.   WIXELA INHUB 250-50 MCG/ACT AEPB Inhale 1 puff into the lungs 2 (two) times daily.   [DISCONTINUED] fluticasone (FLOVENT HFA) 220 MCG/ACT inhaler Inhale into the lungs 2 (two) times daily.    Allergies:   Patient has no known allergies.   Social History   Socioeconomic History   Marital status: Single    Spouse name: Not on file   Number of children: Not on file   Years of education: Not on file   Highest education level: Not on file  Occupational History   Not on file  Tobacco Use   Smoking status: Former    Types: Cigarettes    Quit date: 2000    Years since quitting: 23.9   Smokeless tobacco: Former    Types: Snuff    Quit date: 1980  Substance and Sexual Activity   Alcohol use: Not on file   Drug use: Not on file   Sexual activity: Not on file  Other Topics Concern   Not on file  Social History Narrative   Not on file   Social Determinants of Health  Financial Resource Strain: Not on file  Food Insecurity: Not on file  Transportation Needs: Not on file  Physical Activity: Not on file  Stress: Not on file  Social Connections: Not on file    Family History: The patient's family history includes Benign prostatic hyperplasia in his brother; Cancer - Colon in his brother; Cancer - Lung in his father; Diabetes in his paternal grandmother; Heart attack in his paternal grandfather; Hyperlipidemia in his brother; Hypertension in his brother and mother.  No family history of arrhythmias.  ROS:   Please see the history of present illness.    All other systems reviewed and are negative.  EKGs/Labs/Other Studies Reviewed:    The following studies were reviewed today:  EKG:   03/29/22: SR LVH 02/19/2020: sinus rhythm rate 91, no ST T changes.  Cardiac Studies & Procedures         MONITORS  LONG TERM MONITOR  (3-14 DAYS) 03/21/2020  Narrative  Patient had a min HR of 43 bpm, max HR of 172 bpm, and avg HR of 77 bpm.  Predominant underlying rhythm was sinus rhythm.  One run of tachycardia occurred lasting 11 beats with a max rate of 162 bpm.  This was triggered as symptomatic.  Isolated PACs were rare (<1.0%), with rare couplets or triplets present.  Isolated PVCs were rare (<1.0%), with rare couplets.  No evidence of complete heart block.  Triggered and diary events associated with sinus rhythm, SVT, and PACs.  Rare, symptomatic, SVT and PACs.           Physical Exam:    VS:  BP 118/80   Pulse 68   Ht 5\' 6"  (1.676 m)   Wt 178 lb (80.7 kg)   SpO2 96%   BMI 28.73 kg/m     Wt Readings from Last 3 Encounters:  03/29/22 178 lb (80.7 kg)  03/21/21 171 lb 3.2 oz (77.7 kg)  02/19/20 178 lb (80.7 kg)    GEN: Well nourished, well developed in no acute distress HEENT: Normal NECK: No JVD CARDIAC: RRR, no murmurs, rubs, gallops RESPIRATORY:  Occasional wheezes cleared after first inspiration no tachypnea  ABDOMEN: Soft, non-tender, non-distended MUSCULOSKELETAL:  No edema; No deformity  SKIN: Warm and dry NEUROLOGIC:  Alert and oriented x 3 PSYCHIATRIC:  Normal affect   ASSESSMENT:    1. SVT (supraventricular tachycardia)   2. LVH (left ventricular hypertrophy)   3. Essential hypertension   4. Mixed hyperlipidemia     PLAN:    SVT LVH - continue BB; we have discussed echo and stress needs before starting 1c as we would need to exclude severe LVH or ischemic disease  HTN - controlled on HCTZ, losartan   HLD Aortic atherosclerosis - reviewed his 2019 CT with him - LDL < 70 on current therapy; continue  Mild intermittent asthma - no change in therapy; sees PCP  One year me or APP   Medication Adjustments/Labs and Tests Ordered: Current medicines are reviewed at length with the patient today.  Concerns regarding medicines are outlined above.  Orders Placed  This Encounter  Procedures   EKG 12-Lead   No orders of the defined types were placed in this encounter.   Patient Instructions  Medication Instructions:  Your physician recommends that you continue on your current medications as directed. Please refer to the Current Medication list given to you today.  *If you need a refill on your cardiac medications before your next appointment, please call your pharmacy*  Lab  Work: If you have labs (blood work) drawn today and your tests are completely normal, you will receive your results only by: MyChart Message (if you have MyChart) OR A paper copy in the mail If you have any lab test that is abnormal or we need to change your treatment, we will call you to review the results.  Testing/Procedures: None ordered today.  Follow-Up: At [facility], you and your health needs are our priority.  As part of our continuing mission to provide you with exceptional heart care, we have created designated Provider Care Teams.  These Care Teams include your primary Cardiologist (physician) and Advanced Practice Providers (APPs -  Physician Assistants and Nurse Practitioners) who all work together to provide you with the care you need, when you need it.  We recommend signing up for the patient portal called "MyChart".  Sign up information is provided on this After Visit Summary.  MyChart is used to connect with patients for Virtual Visits (Telemedicine).  Patients are able to view lab/test results, encounter notes, upcoming appointments, etc.  Non-urgent messages can be sent to your provider as well.   To learn more about what you can do with MyChart, go to [email or web address].    Your next appointment:   1 year(s)  The format for your next appointment:   In Person  Provider:   Christell Constant, MD      Important Information About Sugar         Signed, Christell Constant, MD  03/29/2022 2:28 PM    [facility]

## 2022-03-29 NOTE — Patient Instructions (Signed)
Medication Instructions:  Your physician recommends that you continue on your current medications as directed. Please refer to the Current Medication list given to you today.  *If you need a refill on your cardiac medications before your next appointment, please call your pharmacy*  Lab Work: If you have labs (blood work) drawn today and your tests are completely normal, you will receive your results only by: MyChart Message (if you have MyChart) OR A paper copy in the mail If you have any lab test that is abnormal or we need to change your treatment, we will call you to review the results.  Testing/Procedures: None ordered today.  Follow-Up: At [facility], you and your health needs are our priority.  As part of our continuing mission to provide you with exceptional heart care, we have created designated Provider Care Teams.  These Care Teams include your primary Cardiologist (physician) and Advanced Practice Providers (APPs -  Physician Assistants and Nurse Practitioners) who all work together to provide you with the care you need, when you need it.  We recommend signing up for the patient portal called "MyChart".  Sign up information is provided on this After Visit Summary.  MyChart is used to connect with patients for Virtual Visits (Telemedicine).  Patients are able to view lab/test results, encounter notes, upcoming appointments, etc.  Non-urgent messages can be sent to your provider as well.   To learn more about what you can do with MyChart, go to [email or web address].    Your next appointment:   1 year(s)  The format for your next appointment:   In Person  Provider:   Christell Constant, MD      Important Information About Sugar

## 2022-04-18 ENCOUNTER — Telehealth: Payer: Self-pay

## 2022-04-18 NOTE — Telephone Encounter (Signed)
Patient states he is returning a call from [facility].

## 2022-04-18 NOTE — Telephone Encounter (Signed)
Called pt d/t medication alert received via fax from [facility].  Report states pt has refilled both metoprolol tartrate 25 mg and Metoprolol succinate 25 mg.  Would like to ask pt which medication is currently taking.

## 2022-04-18 NOTE — Telephone Encounter (Signed)
Called pt who reports takes metoprolol succinate 25 mg PO QD.   Expresses PCP ordered metoprolol tartrate and pharmacy has filled both prescriptions.  Called pharmacy [facility] reports pt was given both medications.  I requested that metoprolol tartrate be discontinued.  Reports change was made.

## 2022-08-07 ENCOUNTER — Other Ambulatory Visit: Payer: Self-pay | Admitting: *Deleted

## 2022-08-07 MED ORDER — METOPROLOL SUCCINATE ER 25 MG PO TB24: 25.0000 mg | ORAL_TABLET | Freq: Every day | ORAL | 3 refills | Status: DC

## 2022-08-17 ENCOUNTER — Other Ambulatory Visit: Payer: Self-pay | Admitting: Gastroenterology

## 2022-08-17 DIAGNOSIS — K760 Fatty (change of) liver, not elsewhere classified: Secondary | ICD-10-CM

## 2022-09-18 ENCOUNTER — Other Ambulatory Visit: Payer: Commercial Managed Care - HMO

## 2022-09-20 ENCOUNTER — Ambulatory Visit
Admission: RE | Admit: 2022-09-20 | Discharge: 2022-09-20 | Disposition: A | Discharge status: Home / Self Care | Payer: Commercial Managed Care - HMO | Source: Ambulatory Visit | Attending: Gastroenterology | Admitting: Gastroenterology

## 2022-09-20 DIAGNOSIS — K760 Fatty (change of) liver, not elsewhere classified: Secondary | ICD-10-CM

## 2023-02-26 NOTE — Progress Notes (Unsigned)
  Cardiology Office Note:  .   Date:  03/01/2023  ID:  Jerry James, DOB February 10, 1961, MRN [id number] PCP: Clayborn Heron, MD  [facility] Providers Cardiologist:  Manon Hilding MD  History of Present Illness: .    Jerry James is a 61 y.o. male with a hx of HTN, HLD, SVT,  Esophageal spasms. Last seen in office on 03/29/2022 with plans to have echocardiogram and stress test for LVH. Long term monitor revealed SVT and was started on metoprolol succinate 25 mg daily.  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  The patient will require printed prescriptions by an MD to have been filled in [place]  He is here for annual follow-up and is without any complaints.  He denies chest pain, dyspnea on exertion, palpitations are rare, he is tolerating metoprolol 25 mg daily.  He remains active.  ROS: As above otherwise negative.   Studies Reviewed: Marland Kitchen      EKG Interpretation Date/Time:  Thursday March 01 2023 08:38:06 EDT Ventricular Rate:  66 PR Interval:  178 QRS Duration:  88 QT Interval:  384 QTC Calculation: 402 R Axis:   23  Text Interpretation: Normal sinus rhythm Normal ECG No previous ECGs available Confirmed by Joni Reining [phone]) on 03/01/2023 10:00:45 AM    Physical Exam:   VS:  BP 122/80   Pulse 70   Ht 5\' 6"  (1.676 m)   Wt 182 lb (82.6 kg)   SpO2 97%   BMI 29.38 kg/m    Wt Readings from Last 3 Encounters:  03/01/23 182 lb (82.6 kg)  03/29/22 178 lb (80.7 kg)  03/21/21 171 lb 3.2 oz (77.7 kg)    GEN: Well nourished, well developed in no acute distress NECK: No JVD; No carotid bruits CARDIAC: RRR, no murmurs, rubs, gallops RESPIRATORY:  Clear to auscultation without rales, wheezing or rhonchi  ABDOMEN: Soft, non-tender, non-distended EXTREMITIES:  No edema; No deformity   ASSESSMENT AND PLAN: .    Paroxysmal  supraventricular tachycardia: Quiescent with only rare palpitations.  Metoprolol is controlling very well.  The patient requires a written prescription signed by an MD so that he can be filled in [place]  We have asked Dr. Olga Millers, DOD in the office today to sign the prescription.  Electronic prescription is also provided to local [facility] in hopes that it can be transferred when he goes back to [place]  This allows him to have backup prescription.  No changes in his medication regimen.  Will see him again in a year unless something changes.  2.  Hypertension: Followed by PCP and prescriptions provided to their office.  Blood pressure is very well-controlled today.  3.  Hypercholesterolemia: Labs are followed by PCP.  I do not have access to recent labs.  Patient reports that his labs have been normal.  Will defer to PCP for management.     Signed, Bettey Mare. Liborio Nixon, ANP, AACC

## 2023-03-01 ENCOUNTER — Ambulatory Visit: Payer: Managed Care, Other (non HMO) | Attending: Adult Health | Admitting: Adult Health

## 2023-03-01 ENCOUNTER — Encounter: Payer: Self-pay | Admitting: Adult Health

## 2023-03-01 VITALS — BP 122/80 | HR 70 | Ht 66.0 in | Wt 182.0 lb

## 2023-03-01 DIAGNOSIS — E7800 Pure hypercholesterolemia, unspecified: Secondary | ICD-10-CM

## 2023-03-01 DIAGNOSIS — I10 Essential (primary) hypertension: Secondary | ICD-10-CM

## 2023-03-01 DIAGNOSIS — I471 Supraventricular tachycardia, unspecified: Secondary | ICD-10-CM

## 2023-03-01 MED ORDER — METOPROLOL SUCCINATE ER 25 MG PO TB24: 25.0000 mg | ORAL_TABLET | Freq: Every day | ORAL | 3 refills | Status: DC

## 2023-03-01 NOTE — Patient Instructions (Signed)
Medication Instructions:  No Changes *If you need a refill on your cardiac medications before your next appointment, please call your pharmacy*   Lab Work: No Labs If you have labs (blood work) drawn today and your tests are completely normal, you will receive your results only by: MyChart Message (if you have MyChart) OR A paper copy in the mail If you have any lab test that is abnormal or we need to change your treatment, we will call you to review the results.   Testing/Procedures: No Testing   Follow-Up: At [facility], you and your health needs are our priority.  As part of our continuing mission to provide you with exceptional heart care, we have created designated Provider Care Teams.  These Care Teams include your primary Cardiologist (physician) and Advanced Practice Providers (APPs -  Physician Assistants and Nurse Practitioners) who all work together to provide you with the care you need, when you need it.  We recommend signing up for the patient portal called "MyChart".  Sign up information is provided on this After Visit Summary.  MyChart is used to connect with patients for Virtual Visits (Telemedicine).  Patients are able to view lab/test results, encounter notes, upcoming appointments, etc.  Non-urgent messages can be sent to your provider as well.   To learn more about what you can do with MyChart, go to [email or web address].    Your next appointment:   1 year(s)  Provider:   Christell Constant, MD

## 2023-08-14 ENCOUNTER — Other Ambulatory Visit: Payer: Self-pay | Admitting: Internal Medicine

## 2023-09-25 ENCOUNTER — Other Ambulatory Visit: Payer: Self-pay | Admitting: Gastroenterology

## 2023-09-25 DIAGNOSIS — K760 Fatty (change of) liver, not elsewhere classified: Secondary | ICD-10-CM

## 2023-10-05 ENCOUNTER — Ambulatory Visit
Admission: RE | Admit: 2023-10-05 | Discharge: 2023-10-05 | Disposition: A | Discharge status: Home / Self Care | Source: Ambulatory Visit | Attending: Gastroenterology | Admitting: Gastroenterology

## 2023-10-05 DIAGNOSIS — K760 Fatty (change of) liver, not elsewhere classified: Secondary | ICD-10-CM

## 2024-01-30 DIAGNOSIS — Z6828 Body mass index (BMI) 28.0-28.9, adult: Secondary | ICD-10-CM | POA: Diagnosis not present

## 2024-01-30 DIAGNOSIS — M5416 Radiculopathy, lumbar region: Secondary | ICD-10-CM | POA: Diagnosis not present

## 2024-01-30 DIAGNOSIS — M4722 Other spondylosis with radiculopathy, cervical region: Secondary | ICD-10-CM | POA: Diagnosis not present

## 2024-01-31 ENCOUNTER — Other Ambulatory Visit: Payer: Self-pay | Admitting: Neurological Surgery

## 2024-01-31 DIAGNOSIS — M4722 Other spondylosis with radiculopathy, cervical region: Secondary | ICD-10-CM

## 2024-01-31 DIAGNOSIS — M5416 Radiculopathy, lumbar region: Secondary | ICD-10-CM

## 2024-02-01 ENCOUNTER — Encounter: Payer: Self-pay | Admitting: Neurological Surgery

## 2024-02-17 ENCOUNTER — Other Ambulatory Visit: Payer: Medicare (Managed Care)

## 2024-03-04 NOTE — Progress Notes (Signed)
 Cardiology Office Note:  .   Date:  03/17/2024  ID:  Jerry James, DOB 04-Apr-1961, MRN [id number] PCP: Delayne Artist PARAS, MD  [facility] Providers Cardiologist:  Stanly DELENA Leavens, MD    History of Present Illness: .   Jerry James is a 63 y.o. male   with a hx of HTN, HLD, SVT,  Esophageal spasms. Last seen in office on 03/29/2022 with plans to have echocardiogram and stress test for LVH. Long term monitor revealed SVT and was started on metoprolol  succinate 25 mg daily.   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  The patient will require printed prescriptions by an MD to have been filled in [place].  Patient here for f/u. Still living in [place] 1/2 the year. Has occasion skipping that scares him but goes away quickly. He knows the triggers-alcohol is the biggest, caffeine also. No regular exercise. He stays active, travels a lot and walks 5-6 miles/day, does some yoga. Labs from PCP reviewed on patients phone from [facility] portal 03/11/24   Crt 1.09, A1C 5.6  CBC normal, LDL 52,   ROS:    Studies Reviewed: SABRA    EKG Interpretation Date/Time:  Monday March 17 2024 10:08:27 EST Ventricular Rate:  69 PR Interval:  182 QRS Duration:  86 QT Interval:  380 QTC Calculation: 407 R Axis:   2  Text Interpretation: Normal sinus rhythm Normal ECG When compared with ECG of 01-Mar-2023 08:38, No significant change was found Confirmed by Parthenia Klinefelter [phone]) on 03/17/2024 10:22:20 AM    Prior CV Studies:   Monitor 2021 Patient had a min HR of 43 bpm, max HR of 172 bpm, and avg HR of 77 bpm. Predominant underlying rhythm was sinus rhythm. One run of tachycardia occurred lasting 11 beats with a max rate of 162 bpm. This was triggered as symptomatic. Isolated PACs were rare (<1.0%), with rare couplets or triplets present. Isolated PVCs were rare  (<1.0%), with rare couplets. No evidence of complete heart block. Triggered and diary events associated with sinus rhythm, SVT, and PACs.   Rare, symptomatic, SVT and PACs.    Risk Assessment/Calculations:             Physical Exam:   VS:  BP 122/68   Pulse 69   Ht 5' 6 (1.676 m)   Wt 183 lb (83 kg)   SpO2 97%   BMI 29.54 kg/m    Orhtostatics: No data found. Wt Readings from Last 3 Encounters:  03/17/24 183 lb (83 kg)  03/01/23 182 lb (82.6 kg)  03/29/22 178 lb (80.7 kg)    GEN: Well nourished, well developed in no acute distress NECK: No JVD; No carotid bruits CARDIAC:  RRR, no murmurs, rubs, gallops RESPIRATORY:  Clear to auscultation without rales, wheezing or rhonchi  ABDOMEN: Soft, non-tender, non-distended EXTREMITIES:  No edema; No deformity   ASSESSMENT AND PLAN: .    Paroxysmal supraventricular tachycardia: rare palpitations overall controlled on Metoprolol . The patient requires a written prescription signed by an MD so that he can be filled in [place]. Dr. Leavens signed one today  Electronic prescription is also provided to local [facility] in hopes that it can be transferred when he goes back to [place].  This allows him to have backup prescription.      Hypertension: -BP well  controlled  Hypercholesterolemia: Lab reviewed through patient's portal from 03/11/24 and LDL 52. Continue crestor.        Dispo: f/u Dr. JAYSON in 1 yr  Signed, Olivia Pavy, PA-C

## 2024-03-11 ENCOUNTER — Ambulatory Visit
Admission: RE | Admit: 2024-03-11 | Discharge: 2024-03-11 | Disposition: A | Discharge status: Home / Self Care | Payer: Medicare (Managed Care) | Source: Ambulatory Visit | Attending: Neurological Surgery | Admitting: Neurological Surgery

## 2024-03-11 ENCOUNTER — Ambulatory Visit
Admission: RE | Admit: 2024-03-11 | Discharge: 2024-03-11 | Disposition: A | Payer: Medicare (Managed Care) | Source: Ambulatory Visit | Attending: Neurological Surgery | Admitting: Neurological Surgery

## 2024-03-11 DIAGNOSIS — M47812 Spondylosis without myelopathy or radiculopathy, cervical region: Secondary | ICD-10-CM | POA: Diagnosis not present

## 2024-03-11 DIAGNOSIS — M4802 Spinal stenosis, cervical region: Secondary | ICD-10-CM | POA: Diagnosis not present

## 2024-03-11 DIAGNOSIS — M5416 Radiculopathy, lumbar region: Secondary | ICD-10-CM

## 2024-03-11 DIAGNOSIS — M47816 Spondylosis without myelopathy or radiculopathy, lumbar region: Secondary | ICD-10-CM | POA: Diagnosis not present

## 2024-03-11 DIAGNOSIS — M4722 Other spondylosis with radiculopathy, cervical region: Secondary | ICD-10-CM

## 2024-03-17 ENCOUNTER — Ambulatory Visit: Payer: Medicare (Managed Care) | Attending: Physician Assistant | Admitting: Physician Assistant

## 2024-03-17 VITALS — BP 122/68 | HR 69 | Ht 66.0 in | Wt 183.0 lb

## 2024-03-17 DIAGNOSIS — I10 Essential (primary) hypertension: Secondary | ICD-10-CM | POA: Diagnosis not present

## 2024-03-17 DIAGNOSIS — E7800 Pure hypercholesterolemia, unspecified: Secondary | ICD-10-CM

## 2024-03-17 DIAGNOSIS — I471 Supraventricular tachycardia, unspecified: Secondary | ICD-10-CM

## 2024-03-17 MED ORDER — METOPROLOL SUCCINATE ER 25 MG PO TB24: 25.0000 mg | ORAL_TABLET | Freq: Every day | ORAL | 2 refills | Status: DC

## 2024-03-17 MED ORDER — METOPROLOL SUCCINATE ER 25 MG PO TB24: 25.0000 mg | ORAL_TABLET | Freq: Every day | ORAL | 2 refills | Status: AC

## 2024-03-17 NOTE — Patient Instructions (Signed)
 Medication Instructions:   Your physician recommends that you continue on your current medications as directed. Please refer to the Current Medication list given to you today.   *If you need a refill on your cardiac medications before your next appointment, please call your pharmacy*   Lab Work: NONE ORDERED  TODAY    If you have labs (blood work) drawn today and your tests are completely normal, you will receive your results only by: MyChart Message (if you have MyChart) OR A paper copy in the mail If you have any lab test that is abnormal or we need to change your treatment, we will call you to review the results.    Testing/Procedures: NONE ORDERED  TODAY      Follow-Up: At [facility], you and your health needs are our priority.  As part of our continuing mission to provide you with exceptional heart care, our providers are all part of one team.  This team includes your primary Cardiologist (physician) and Advanced Practice Providers or APPs (Physician Assistants and Nurse Practitioners) who all work together to provide you with the care you need, when you need it.  Your next appointment:    1 year(s)  Provider:    Stanly DELENA Leavens, MD   We recommend signing up for the patient portal called MyChart.  Sign up information is provided on this After Visit Summary.  MyChart is used to connect with patients for Virtual Visits (Telemedicine).  Patients are able to view lab/test results, encounter notes, upcoming appointments, etc.  Non-urgent messages can be sent to your provider as well.   To learn more about what you can do with MyChart, go to [email or web address].   Other Instructions
